# Patient Record
Sex: Female | Born: 2000 | Race: White | Hispanic: No | Marital: Single | Smoking: Never smoker
Health system: Southern US, Community
[De-identification: ages and names within clinical notes are randomized; demographics above are authoritative.]

## PROBLEM LIST (undated history)

## (undated) DIAGNOSIS — B2790 Infectious mononucleosis, unspecified without complication: Secondary | ICD-10-CM

## (undated) DIAGNOSIS — R17 Unspecified jaundice: Secondary | ICD-10-CM

## (undated) DIAGNOSIS — R7989 Other specified abnormal findings of blood chemistry: Secondary | ICD-10-CM

## (undated) HISTORY — DX: Infectious mononucleosis, unspecified without complication: B27.90

## (undated) HISTORY — DX: Unspecified jaundice: R17

## (undated) HISTORY — DX: Other specified abnormal findings of blood chemistry: R79.89

## (undated) HISTORY — PX: NO PAST SURGERIES: SHX2092

---
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
## 2020-05-01 ENCOUNTER — Ambulatory Visit (INDEPENDENT_AMBULATORY_CARE_PROVIDER_SITE_OTHER): Admitting: Nurse Practitioner

## 2020-05-01 ENCOUNTER — Encounter: Payer: Self-pay | Admitting: Nurse Practitioner

## 2020-05-01 ENCOUNTER — Other Ambulatory Visit (INDEPENDENT_AMBULATORY_CARE_PROVIDER_SITE_OTHER)

## 2020-05-01 VITALS — BP 88/50 | HR 100 | Temp 98.9°F | Ht 63.0 in | Wt 123.0 lb

## 2020-05-01 DIAGNOSIS — B179 Acute viral hepatitis, unspecified: Secondary | ICD-10-CM

## 2020-05-01 DIAGNOSIS — B2799 Infectious mononucleosis, unspecified with other complication: Secondary | ICD-10-CM

## 2020-05-01 DIAGNOSIS — B2790 Infectious mononucleosis, unspecified without complication: Secondary | ICD-10-CM

## 2020-05-01 DIAGNOSIS — B199 Unspecified viral hepatitis without hepatic coma: Secondary | ICD-10-CM | POA: Insufficient documentation

## 2020-05-01 DIAGNOSIS — B251 Cytomegaloviral hepatitis: Secondary | ICD-10-CM | POA: Diagnosis not present

## 2020-05-01 DIAGNOSIS — B178 Other specified acute viral hepatitis: Secondary | ICD-10-CM | POA: Insufficient documentation

## 2020-05-01 LAB — HEPATIC FUNCTION PANEL
ALT: 227 U/L — ABNORMAL HIGH (ref 0–35)
AST: 195 U/L — ABNORMAL HIGH (ref 0–37)
Albumin: 3.3 g/dL — ABNORMAL LOW (ref 3.5–5.2)
Alkaline Phosphatase: 727 U/L — ABNORMAL HIGH (ref 39–117)
Bilirubin, Direct: 1.3 mg/dL — ABNORMAL HIGH (ref 0.0–0.3)
Total Bilirubin: 2.7 mg/dL — ABNORMAL HIGH (ref 0.2–1.2)
Total Protein: 6.5 g/dL (ref 6.0–8.3)

## 2020-05-01 LAB — PROTIME-INR
INR: 0.9 ratio (ref 0.8–1.0)
Prothrombin Time: 10.4 s (ref 9.6–13.1)

## 2020-05-01 NOTE — Progress Notes (Signed)
05/01/2020 Elizabeth Suarez [id number] 2001/03/16   CHIEF COMPLAINT:  Acute hepatitis   HISTORY OF PRESENT ILLNESS: Elizabeth Suarez is a 19 year old female with no significant past medical history.  Confirmed COVID-19 infection April 2021 and subsequently received a Moderna vaccination.  She turned 19 on [birth year]2020 and she drank 4 shots of alcohol on her birthday and on 04/11/2020. She had URI symptoms 1 week prior. She continued to feel fatigued, she had difficulty eating and drinking any fluid.  Her urine was dark in color.  She also noted having a fever of 102 F. She went to [facility] on 04/15/2020.  She received IV fluids and she stated Covid and flu testing were negative.  Mono spot testing was positive.  She stated she was prescribed Bactrim 1 tab daily for presumed UTI which she took for 3 days and stopped it.  She continued to have a poor appetite, urine remained dark and her eyes turned yellow with jaundice.  She developed nausea and vomiting.  No upper or lower abdominal pain.  She continued to have mild URI symptoms and she developed a red raised rash to her neck on 11/20.  She presented to [facility] 11/20 for further evaluation.  A Covid test was initially positive then 2 or 3 subsequent Covid test were negative and it was determined that she did not have active Covid infection.  Chest x-ray was negative.  Respiratory viral panel was positive for rhinovirus/enterovirus.  Strep negative. Labs in the ED showed a WBC 20.5. Hemoglobin 13.2.  Total bili 6.3.  Alk phos 929.  AST 479.  ALT 422.  INR 1.06.  BUN 10.  Creatinine 0.8. CT of the abdomen pelvis showed periportal edema, gallbladder wall thickening, small volume ascites and mild splenomegaly. She was transferred to [facility] 04/20/2020.  HIV, hepatitis A and B IgM and hepatitis C antibody levels were negative.  Ceruloplasmin level was normal. CMV and EBV IgM positive.  EBV viral load 14,559. Abdominal MRI with MRCP with  and without contrast 11/23 showed marked gallbladder wall thickening, pancreatic divisum, splenomegaly without evidence of biliary obstruction.  She was evaluated by hepatology and ID who agreed she had acute EBV and CMV infection. She received supportive care and her LFTs stabilized. Alk phos 1133. Total bili 8.0.  AST 396.  ALT 293 at the time of discharge on 04/24/2020.  She followed up with her PCP on 04/28/2020 and labs showed  Alk phos 1002. T. Bili 4.1. AST 215. ALT 200. WBC16.5 Hg 11.7. HCT 34.8. PLT 289. TSH 1.81. She was referred to our office for further hepatology evaluation.   Currently, her appetite has improved.  She is eating 3 healthy meals daily.  Her urine is golden yellow in color.  She is passing normal formed brown bowel movements daily.  No rectal bleeding or black stool.  She continues to have a diffuse red raised rash to her neck, chest, abdomen, back, arms, buttocks and legs.  She continues to have a fever at night with sweats.  Her temperature last night was 101.4.  She took extra strength Tylenol 500 mg 2 tabs on 11/18, 11/19 and 04/20/2019.  She took Ibuprofen 600 mg for fever on 11/30 and on 12/1.  She previously took Advil 200 mg 2 tabs 3 times monthly for headaches or aches and pains.  She denies excessive Tylenol use.  No alcohol since 11/12.  No prior history of alcohol abuse or binge drinking.  No drug use.  No confusion.  No family history of liver disease. Her mother is present.   Abdominal/pelvic CT with contrast 04/19/2020: 1. No evidence of pulmonary embolism. No acute intrathoracic  process.  2. Wedge-shaped soft tissue attenuation in the anterior mediastinum  is most likely reflective of thymic remnant in a patient of this age  given location, configuration, and absence of surrounding fat  stranding or adjacent traumatic features such as sternal fracture.  3. Periportal edema, nonspecific, can be seen with aggressive  hydration and acute intrinsic liver  disease/hepatitis.  4. Circumferential thickening of the gallbladder with mucosal  hyperemia. Nonspecific given the suggestion of possible intrinsic  liver disease though acute cholecystitis is not excluded. No visible  calcified gallstones or biliary ductal dilatation. If there is  clinical concern for acute cholecystitis, consider further  evaluation with right upper quadrant ultrasound.  5. Small volume simple attenuation free fluid seen predominantly  along the inferior margin of the liver, tracking paracolic gutter,  and layering in the deep pelvis, greater than expected for  physiologic free fluid. Possibly reactive or related to acute liver  disease.  6. Mild splenomegaly.  7. Nonspecific prominence of the gonadal veins bilaterally. Could be  seen with pelvic congestion syndrome in the appropriate clinical  setting.  8. Small amount of contrast media in the distal thoracic esophagus,  correlate for features of reflux.   :  Abdominal/MRI with MRCP with and without contrast 04/22/2020: Marked gallbladder wall edema.  Nonspecific, this can be seen in the setting of hypoalbuminemia or underlying acute liver injury. Pancreas divisum.  No evidence of biliary obstruction. Splenomegaly, which may be secondary to patient's mononucleosis 1.3 cm enhancing lesion in the left breast.  Social history: The patient is a Electronics engineer.  Non-smoker.  No drug use.  See alcohol intake in HPI.  Family history: No family history of liver disease.   No Known Allergies    Outpatient Encounter Medications as of 05/01/2020  Medication Sig  . ibuprofen (ADVIL,MOTRIN) 200 MG tablet Take 600 mg by mouth every 6 (six) hours as needed.  Marland Kitchen ibuprofen (ADVIL,MOTRIN) 400 MG tablet Take 1 tablet (400 mg total) by mouth every 6 (six) hours as needed.   No facility-administered encounter medications on file as of 05/01/2020.     REVIEW OF SYSTEMS:  Gen: See HPI.  No weight loss. CV: Denies chest pain,  palpitations or edema. Resp: + Cough is improving. GI: Denies heartburn, dysphagia, stomach or lower abdominal pain. No diarrhea or constipation.  GU : See HPI. MS: Generalized weakness. Derm: Diffuse red raised rash to neck, chest, abdomen, back, extremities and buttocks Psych: Denies depression or anxiety. Heme: Denies bruising, bleeding. Neuro:  Denies headaches, dizziness or paresthesias. Endo:  Denies any problems with DM, thyroid or adrenal function.   PHYSICAL EXAM: BP (!) 88/50 (BP Location: Left Arm, Patient Position: Sitting, Cuff Size: Normal)   Pulse 100   Temp 98.9 F (37.2 C)   Ht _0  (1.6 m) Comment: height measured without shoes  Wt 123 lb (55.8 kg)   LMP 03/31/2020   BMI 21.79 kg/m   General: Well developed 19 year old female in no acute distress. Head: Normocephalic and atraumatic. Eyes: Mild scleral icterus, conjunctive pink. Ears: Normal auditory acuity. Mouth: Dentition intact. No ulcers or lesions.  Neck: Supple, no lymphadenopathy or thyromegaly.  Lungs: Clear bilaterally to auscultation without wheezes, crackles or rhonchi. Heart: Regular rate and rhythm. No murmur, rub or gallop appreciated.  Abdomen: Soft, nontender, non distended.  No masses. No obvious hepatosplenomegaly. Normoactive bowel sounds x 4 quadrants.  Rectal: Deferred. Musculoskeletal: Symmetrical with no gross deformities. Skin: Warm and dry.  Scattered erythematous macular papular rash to neck, back, abdomen, chest, extremities.  No jaundice. Extremities: No edema. Neurological: Alert oriented x 4, no focal deficits.  Psychological:  Alert and cooperative. Normal mood and affect.  ASSESSMENT AND PLAN:  64.  19 year old female with viral hepatitis with acute liver injury/cholestasis. Positive EBV IgM and Viral load + Positive CMV IgM -Repeat hepatic panel, INR.  ANA, SMA, AMA and IgG level. -No plans for liver biopsy at this time -No indication for Prednisone  -May take Tylenol 500  mg 2 tabs p.o. once daily for fever as needed -Supportive care -May require ID consult/follow up -College excuse letter provided for 1 week, most likely will require several weeks off school -Further recommendations to be determined after the above lab results received -Follow up in the office with Dr. Loletha Carrow in 2 weeks  2. Leukocytosis, secondary to # 1    CC:  Susy Frizzle, MD

## 2020-05-01 NOTE — Patient Instructions (Addendum)
Your provider has requested that you go to the basement level for lab work before leaving today. Press "B" on the elevator. The lab is located at the first door on the left as you exit the elevator.  If you are age 21 or older, your body mass index should be between 23-30. Your Body mass index is 21.79 kg/m. If this is out of the aforementioned range listed, please consider follow up with your Primary Care Provider.  If you are age 26 or younger, your body mass index should be between 19-25. Your Body mass index is 21.79 kg/m. If this is out of the aformentioned range listed, please consider follow up with your Primary Care Provider.   DO NOT TAKE PREDNISONE   Please keep follow up with Dr. Myrtie Neither on- 05/12/20 @ 2:00pm   Due to recent changes in healthcare laws, you may see the results of your imaging and laboratory studies on MyChart before your provider has had a chance to review them.  We understand that in some cases there may be results that are confusing or concerning to you. Not all laboratory results come back in the same time frame and the provider may be waiting for multiple results in order to interpret others.  Please give [place] 48 hours in order for your provider to thoroughly review all the results before contacting the office for clarification of your results.   Thank you for choosing me and [facility].  [employer] CRNP

## 2020-05-02 ENCOUNTER — Other Ambulatory Visit: Payer: Self-pay

## 2020-05-02 DIAGNOSIS — B251 Cytomegaloviral hepatitis: Secondary | ICD-10-CM

## 2020-05-02 DIAGNOSIS — B179 Acute viral hepatitis, unspecified: Secondary | ICD-10-CM

## 2020-05-02 DIAGNOSIS — B2790 Infectious mononucleosis, unspecified without complication: Secondary | ICD-10-CM

## 2020-05-05 ENCOUNTER — Other Ambulatory Visit: Payer: Self-pay | Admitting: Nurse Practitioner

## 2020-05-05 MED ORDER — HYDROXYZINE HCL 10 MG PO TABS: 10.0000 mg | ORAL_TABLET | Freq: Three times a day (TID) | ORAL | 0 refills | Status: DC | PRN

## 2020-05-06 ENCOUNTER — Telehealth: Payer: Self-pay | Admitting: Nurse Practitioner

## 2020-05-06 LAB — ANA: Anti Nuclear Antibody (ANA): NEGATIVE

## 2020-05-06 LAB — ANTI-SMOOTH MUSCLE ANTIBODY, IGG: Actin (Smooth Muscle) Antibody (IGG): 47 U — ABNORMAL HIGH (ref <20)

## 2020-05-06 LAB — MITOCHONDRIAL ANTIBODIES: Mitochondrial M2 Ab, IgG: <=20 U

## 2020-05-06 LAB — IGG: IgG (Immunoglobin G), Serum: 1028 mg/dL (ref 600–1640)

## 2020-05-06 NOTE — Telephone Encounter (Signed)
Beth, you previously entered a lab order for a hepatic panel to be done this week on Thursday 12/9 as requested.  Can you please change the lab order to include a hepatic panel and add PT/INR. Patient can go to the lab tomorrow Wed 12/8 or Thurs 12/9.

## 2020-05-06 NOTE — Progress Notes (Signed)
____________________________________________________________  Attending physician addendum:  Thank you for sending this case to me and discussing it the day of clinic visit. I have reviewed the entire note and agree with the plan.  Protracted course of cholestatic hepatitis from acute viral infection.   Had hepatology evaluation during recent hospitalization, and the CT findings of peri-portal and GB edema are secondary to the hepatitis.  Labs day of clinic visit here show slowly improving LFTs, normal INR and negative auto-immune labs.  Rash cause unclear, but perhaps reaction to sulfa antibiotic.  I expect she will continue to improve and will see her in clinic as scheduled next week.  Amada Jupiter, MD  ____________________________________________________________

## 2020-05-07 ENCOUNTER — Other Ambulatory Visit (INDEPENDENT_AMBULATORY_CARE_PROVIDER_SITE_OTHER)

## 2020-05-07 ENCOUNTER — Other Ambulatory Visit: Payer: Self-pay

## 2020-05-07 ENCOUNTER — Telehealth: Payer: Self-pay | Admitting: Gastroenterology

## 2020-05-07 DIAGNOSIS — B179 Acute viral hepatitis, unspecified: Secondary | ICD-10-CM

## 2020-05-07 DIAGNOSIS — B178 Other specified acute viral hepatitis: Secondary | ICD-10-CM | POA: Diagnosis not present

## 2020-05-07 DIAGNOSIS — B2790 Infectious mononucleosis, unspecified without complication: Secondary | ICD-10-CM

## 2020-05-07 DIAGNOSIS — B2799 Infectious mononucleosis, unspecified with other complication: Secondary | ICD-10-CM | POA: Diagnosis not present

## 2020-05-07 DIAGNOSIS — B251 Cytomegaloviral hepatitis: Secondary | ICD-10-CM

## 2020-05-07 LAB — PROTIME-INR
INR: 1 ratio (ref 0.8–1.0)
Prothrombin Time: 11.7 s (ref 9.6–13.1)

## 2020-05-07 LAB — HEPATIC FUNCTION PANEL
ALT: 380 U/L — ABNORMAL HIGH (ref 0–35)
AST: 244 U/L — ABNORMAL HIGH (ref 0–37)
Albumin: 3.9 g/dL (ref 3.5–5.2)
Alkaline Phosphatase: 543 U/L — ABNORMAL HIGH (ref 39–117)
Bilirubin, Direct: 1 mg/dL — ABNORMAL HIGH (ref 0.0–0.3)
Total Bilirubin: 2.1 mg/dL — ABNORMAL HIGH (ref 0.2–1.2)
Total Protein: 7 g/dL (ref 6.0–8.3)

## 2020-05-07 NOTE — Telephone Encounter (Signed)
Repeat labs ordered STAT for 05/12/20. CBC CMP and PT/INR. Patient is to come in the morning. Her appointment with Dr Myrtie Neither is scheduled for 2:00 pm.

## 2020-05-07 NOTE — Telephone Encounter (Signed)
Yes, no problem. Lab added.

## 2020-05-07 NOTE — Telephone Encounter (Signed)
Spoke with Ms Elizabeth Suarez. She is inquiring about the increase in some of the liver function test numbers. Wants to know if this was expected since patient started the prednisone. Also will need a note for the college stating a specific date she will be out even if that has to be changed and extended as the end date draws near. This will help them to assist her with her out of class studies. She can access the new note through My Chart and forward to the school. Thanks

## 2020-05-08 ENCOUNTER — Encounter: Payer: Self-pay | Admitting: Nurse Practitioner

## 2020-05-08 NOTE — Telephone Encounter (Signed)
Elizabeth Suarez, I entered a very brief letter excusing her from class until 12/15. Further recommendations will be determined after her repeat labs and after Dr. Myrtie Neither sees her. I called the patient and discussed her mild increase in her AST and ALT levels are due to the liver inflammation from her viral infection and is not worrisome at this time. Her INR is normal and T. Bili is less which is reassuring.  We will see how her labs look on Monday and Dr. Myrtie Neither will review those results and determine what further evaluation is required at that point.  Thank you

## 2020-05-12 ENCOUNTER — Ambulatory Visit (INDEPENDENT_AMBULATORY_CARE_PROVIDER_SITE_OTHER): Admitting: Gastroenterology

## 2020-05-12 ENCOUNTER — Encounter: Payer: Self-pay | Admitting: Gastroenterology

## 2020-05-12 ENCOUNTER — Other Ambulatory Visit (INDEPENDENT_AMBULATORY_CARE_PROVIDER_SITE_OTHER)

## 2020-05-12 VITALS — BP 102/64 | HR 64 | Ht 63.0 in | Wt 116.0 lb

## 2020-05-12 DIAGNOSIS — B178 Other specified acute viral hepatitis: Secondary | ICD-10-CM | POA: Diagnosis not present

## 2020-05-12 DIAGNOSIS — B2799 Infectious mononucleosis, unspecified with other complication: Secondary | ICD-10-CM | POA: Diagnosis not present

## 2020-05-12 DIAGNOSIS — R7989 Other specified abnormal findings of blood chemistry: Secondary | ICD-10-CM

## 2020-05-12 DIAGNOSIS — R945 Abnormal results of liver function studies: Secondary | ICD-10-CM

## 2020-05-12 LAB — CBC WITH DIFFERENTIAL/PLATELET
Basophils Absolute: 0.1 10*3/uL (ref 0.0–0.1)
Basophils Relative: 0.7 % (ref 0.0–3.0)
Eosinophils Absolute: 0 10*3/uL (ref 0.0–0.7)
Eosinophils Relative: 0.3 % (ref 0.0–5.0)
HCT: 40 % (ref 36.0–46.0)
Hemoglobin: 13.3 g/dL (ref 12.0–15.0)
Lymphocytes Relative: 59.4 % — ABNORMAL HIGH (ref 12.0–46.0)
Lymphs Abs: 5.4 10*3/uL — ABNORMAL HIGH (ref 0.7–4.0)
MCHC: 33.3 g/dL (ref 30.0–36.0)
MCV: 87.3 fl (ref 78.0–100.0)
Monocytes Absolute: 1.1 10*3/uL — ABNORMAL HIGH (ref 0.1–1.0)
Monocytes Relative: 12.2 % — ABNORMAL HIGH (ref 3.0–12.0)
Neutro Abs: 2.5 10*3/uL (ref 1.4–7.7)
Neutrophils Relative %: 27.4 % — ABNORMAL LOW (ref 43.0–77.0)
Platelets: 496 10*3/uL — ABNORMAL HIGH (ref 150.0–400.0)
RBC: 4.59 Mil/uL (ref 3.87–5.11)
RDW: 17.3 % — ABNORMAL HIGH (ref 11.5–15.5)
WBC: 9 10*3/uL (ref 4.0–10.5)

## 2020-05-12 LAB — COMPREHENSIVE METABOLIC PANEL
ALT: 141 U/L — ABNORMAL HIGH (ref 0–35)
AST: 42 U/L — ABNORMAL HIGH (ref 0–37)
Albumin: 4.5 g/dL (ref 3.5–5.2)
Alkaline Phosphatase: 319 U/L — ABNORMAL HIGH (ref 39–117)
BUN: 14 mg/dL (ref 6–23)
CO2: 29 mEq/L (ref 19–32)
Calcium: 10.1 mg/dL (ref 8.4–10.5)
Chloride: 101 mEq/L (ref 96–112)
Creatinine, Ser: 0.58 mg/dL (ref 0.40–1.20)
GFR: 129.66 mL/min (ref >60.00)
Glucose, Bld: 96 mg/dL (ref 70–99)
Potassium: 4.4 mEq/L (ref 3.5–5.1)
Sodium: 140 mEq/L (ref 135–145)
Total Bilirubin: 1.7 mg/dL — ABNORMAL HIGH (ref 0.2–1.2)
Total Protein: 7.9 g/dL (ref 6.0–8.3)

## 2020-05-12 LAB — PROTIME-INR
INR: 1 ratio (ref 0.8–1.0)
Prothrombin Time: 11.1 s (ref 9.6–13.1)

## 2020-05-12 NOTE — Progress Notes (Addendum)
[facility] Progress Note  Chief Complaint: Acute mononucleosis hepatitis with jaundice  Subjective  History:  Elizabeth Suarez is here with her mother today.  I have had multiple conversations and chart/lab review with our nurse practitioner who saw Elizabeth Suarez recently.  I have been periodically reviewing lab results and helping coordinate the plan and follow-up.  Elizabeth Suarez has slowly improved over the last couple of weeks.  She does not have abdominal pain nausea or vomiting.  Her appetite and energy level are returning. She took f several days of prednisone that have been prescribed by primary care, and the diffuse trunk and extremity rash completely resolved.  ROS: Cardiovascular:  no chest pain Respiratory: no dyspnea Generalized fatigue The patient's Past Medical, Family and Social History were reviewed and are on file in the EMR.  Objective:  Med list reviewed  Current Outpatient Medications:  .  hydrOXYzine (ATARAX/VISTARIL) 10 MG tablet, Take 1 tablet (10 mg total) by mouth 3 (three) times daily as needed for itching., Disp: 21 tablet, Rfl: 0   Vital signs in last 24 hrs: Vitals:   05/12/20 1408  BP: 102/64  Pulse: 64    Physical Exam   HEENT: sclera faintly icteric, oral mucosa moist without lesions  Neck: supple, no thyromegaly, JVD or lymphadenopathy  Cardiac: RRR without murmurs, S1S2 heard, no peripheral edema  Pulm: clear to auscultation bilaterally, normal RR and effort noted  Abdomen: soft, no tenderness, with active bowel sounds. No palpable hepatomegaly or splenomegaly  Skin; warm and dry, no jaundice or rash (reports it has been present over the trunk and both arms and legs.  Her mother showed me a photograph, same I had seen from our NP last week.  Labs:  CMP Latest Ref Rng & Units 05/12/2020 05/07/2020 05/01/2020  Glucose 70 - 99 mg/dL 96 - -  BUN 6 - 23 mg/dL 14 - -  Creatinine 2.83 - 1.20 mg/dL 6.62 - -  Sodium 947 - 145 mEq/L 140 - -   Potassium 3.5 - 5.1 mEq/L 4.4 - -  Chloride 96 - 112 mEq/L 101 - -  CO2 19 - 32 mEq/L 29 - -  Calcium 8.4 - 10.5 mg/dL 65.4 - -  Total Protein 6.0 - 8.3 g/dL 7.9 7.0 6.5  Total Bilirubin 0.2 - 1.2 mg/dL 6.5(K) 2.1(H) 2.7(H)  Alkaline Phos 39 - 117 U/L 319(H) 543(H) 727(H)  AST 0 - 37 U/L 42(H) 244(H) 195(H)  ALT 0 - 35 U/L 141(H) 380(H) 227(H)   Elevated ASMA Ab at 47 (recent normal ANA and IgG)  Additional records related to her recent hospitalizations were also reviewed.  CBC Latest Ref Rng & Units 05/12/2020  WBC 4.0 - 10.5 K/uL 9.0  Hemoglobin 12.0 - 15.0 g/dL 35.4  Hematocrit 65.6 - 46.0 % 40.0  Platelets 150.0 - 400.0 K/uL 496.0(H)     Lab Results  Component Value Date   INR 1.0 05/12/2020   INR 1.0 05/07/2020   INR 0.9 05/01/2020    ___________________________________________ Radiologic studies:   ____________________________________________ Other:   _____________________________________________ Assessment & Plan  Assessment: Encounter Diagnoses  Name Primary?  . Infectious mononucleosis hepatitis Yes  . LFTs abnormal    Elizabeth Suarez has severe but resolving cholestatic hepatitis from acute mononucleosis infection.  She was extensively worked up at [facility] and reportedly seen by hepatology there. I believe her rash was a reaction to Bactrim, and I recommended that she list that as an allergy and avoid sulfa-containing medicines in the future.  The elevated smooth muscle antibody is of unclear significance, but I think autoimmune hepatitis is a less likely possibility here.  Her LFTs were already improving, albeit slowly, before the prednisone was started.  Elevated ASMA can be seen in other types of hepatitis besides AIH (alcohol, infectious, drug-induced).  I will certainly keep it in mind in case her LFTs do not normalize as expected, and certainly if they start to rise again.  In that case, liver biopsy may be warranted.  I mentioned that briefly with them, but I  expect her condition will continue to improve   Plan: Hepatic function panel in a week  She will be off college for holiday break until mid January, and from my perspective has no specific restrictions related to this condition as it stands now.   30 minutes were spent on this encounter (including extensive chart review, history/exam, counseling/coordination of care, and documentation)  Charlie Pitter III

## 2020-05-12 NOTE — Patient Instructions (Signed)
If you are age 21 or older, your body mass index should be between 23-30. Your Body mass index is 20.55 kg/m. If this is out of the aforementioned range listed, please consider follow up with your Primary Care Provider.  If you are age 6 or younger, your body mass index should be between 19-25. Your Body mass index is 20.55 kg/m. If this is out of the aformentioned range listed, please consider follow up with your Primary Care Provider.   Please repeat labs in 1 week!  05-19-2020  It was a pleasure to see you today!  Dr. Myrtie Neither

## 2020-05-13 ENCOUNTER — Other Ambulatory Visit: Payer: Self-pay

## 2020-05-13 DIAGNOSIS — B2799 Infectious mononucleosis, unspecified with other complication: Secondary | ICD-10-CM

## 2020-05-13 DIAGNOSIS — R7989 Other specified abnormal findings of blood chemistry: Secondary | ICD-10-CM

## 2020-05-20 ENCOUNTER — Other Ambulatory Visit (INDEPENDENT_AMBULATORY_CARE_PROVIDER_SITE_OTHER)

## 2020-05-20 DIAGNOSIS — R7989 Other specified abnormal findings of blood chemistry: Secondary | ICD-10-CM

## 2020-05-20 DIAGNOSIS — R945 Abnormal results of liver function studies: Secondary | ICD-10-CM | POA: Diagnosis not present

## 2020-05-20 DIAGNOSIS — B178 Other specified acute viral hepatitis: Secondary | ICD-10-CM | POA: Diagnosis not present

## 2020-05-20 DIAGNOSIS — B2799 Infectious mononucleosis, unspecified with other complication: Secondary | ICD-10-CM

## 2020-05-20 LAB — HEPATIC FUNCTION PANEL
ALT: 233 U/L — ABNORMAL HIGH (ref 0–35)
AST: 128 U/L — ABNORMAL HIGH (ref 0–37)
Albumin: 4.3 g/dL (ref 3.5–5.2)
Alkaline Phosphatase: 162 U/L — ABNORMAL HIGH (ref 39–117)
Bilirubin, Direct: 0.5 mg/dL — ABNORMAL HIGH (ref 0.0–0.3)
Total Bilirubin: 1.2 mg/dL (ref 0.2–1.2)
Total Protein: 7.1 g/dL (ref 6.0–8.3)

## 2020-05-21 ENCOUNTER — Telehealth: Payer: Self-pay | Admitting: Gastroenterology

## 2020-05-21 NOTE — Telephone Encounter (Signed)
Spoke with patient's mother, she has several questions regarding the reasoning behind liver biopsy - discussed the increase in some of the labs, mother states that patient has been off of Prednisone for about a week for a rash. She states that some of her levels were fairly low the last time, advised that this could have been from the prednisone as it reduces inflammation and now that she is no longer taking it the numbers have increased again. Mother stated that there are risks associated with having the biopsy and wanting to know if the labs could be watched longer before proceeding with the biopsy. She states that the patient does not really understand what is going on. Will you call and discuss with the mother? Her number is [phone]. Thank you.

## 2020-05-21 NOTE — Telephone Encounter (Signed)
Inbound call from patient's mother.  States Dr. Myrtie Neither spoke with patient today in regards to her lab results and about having a liver biopsy.  Mother has additional questions and requests a call back please.

## 2020-05-22 ENCOUNTER — Other Ambulatory Visit: Payer: Self-pay

## 2020-05-22 ENCOUNTER — Telehealth: Payer: Self-pay | Admitting: Family Medicine

## 2020-05-22 DIAGNOSIS — R7989 Other specified abnormal findings of blood chemistry: Secondary | ICD-10-CM

## 2020-05-22 NOTE — Telephone Encounter (Signed)
Patients mother Lillie Columbia called in requesting an appointment. It looks as if patient hasn't been seen since 2015. I explained the 3 year policy that patient would now be considered a new patient. Mother explained that patient hasn't been sick until now and they didn't know the 3 year policy.   She really would like you to consider taking patient back since she was sick over the Thanksgiving holiday and now they are recommending a liver biopsy and she trust your opinion and you see all of the family.  CB# [phone]

## 2020-05-22 NOTE — Telephone Encounter (Signed)
Order in epic. 

## 2020-05-22 NOTE — Telephone Encounter (Signed)
I will be glad to see her.

## 2020-05-22 NOTE — Telephone Encounter (Signed)
I spoke with Elizabeth Suarez's mother Elizabeth Suarez regarding the elevated LFTs and my recommendation for liver biopsy.  She had questions and concerns, all of which were addressed.  She tells me that she and [name] discussed it last evening and had decided to hold off on the liver biopsy and get repeat labs soon.  Please put in orders for a hepatic function panel in the morning of December 29th.  I let Elizabeth Suarez know that I would be out of town all next week and following up on this when I return on January 3.  However, please check the lab results later in the day on the 29th or the following day and, if rising, please ask the DOD to review it to ensure nothing requiring urgent attention prior to the office being closed for new year holiday.  - HD

## 2020-05-26 NOTE — Telephone Encounter (Signed)
Patient is scheduled for 05/27/2020 at 12:00.

## 2020-05-27 ENCOUNTER — Other Ambulatory Visit: Payer: Self-pay

## 2020-05-27 ENCOUNTER — Ambulatory Visit (INDEPENDENT_AMBULATORY_CARE_PROVIDER_SITE_OTHER): Admitting: Family Medicine

## 2020-05-27 VITALS — BP 108/60 | HR 81 | Temp 97.4°F | Ht 63.0 in | Wt 120.0 lb

## 2020-05-27 DIAGNOSIS — K759 Inflammatory liver disease, unspecified: Secondary | ICD-10-CM | POA: Diagnosis not present

## 2020-05-27 NOTE — Progress Notes (Signed)
Subjective:    Patient ID: Elizabeth Suarez, female    DOB: 2000/08/21, 19 y.o.   MRN: [id number]  HPI  Patient is a very pleasant 19 year old Caucasian female here today to reestablish care.  She has had a very complicated recent history.  In November, she was celebrating her 19st birthday.  She became extremely ill shortly thereafter.  Her liver function tests were found to be extremely high.  [facility].  She tested positive for mono.  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  However the symptoms continue to worsen.  She was started on Bactrim for a "UTI.  She then developed diffuse itching all over the body in early December along with elevated liver function test and jaundice.  Ultimately she developed a diffuse purpuric rash on all of her extremities.  She was told that she had a Stevens-Johnson reaction to Bactrim.  Bactrim was stopped immediately and she was started on prednisone.  Around the same time that she started prednisone, she had a lab test that showed a positive anti-smooth muscle antibody test.  While on the prednisone, her liver function test started to trend down dramatically.  She then stop the prednisone after the rash subsided.  Repeat liver function test last week have shown a slight elevation.  Therefore her gastroenterologist recently recommended a liver biopsy to distinguish between post viral hepatitis secondary to EBV versus autoimmune hepatitis partially treated with prednisone.  Family is here for a second opinion. Past Medical History:  Diagnosis Date  . Elevated LFTs   . Jaundice   . Mononucleosis    Past Surgical History:  Procedure Laterality Date  . NO PAST SURGERIES     No current outpatient medications on file prior to visit.   No current facility-administered medications on file prior to visit.   Allergies  Allergen Reactions  . Bactrim [Sulfamethoxazole-Trimethoprim] Rash  . Sulfa Antibiotics Rash   Social  History   Socioeconomic History  . Marital status: Single    Spouse name: Not on file  . Number of children: 0  . Years of education: Not on file  . Highest education level: Not on file  Occupational History  . Occupation: Consulting civil engineerstudent  Tobacco Use  . Smoking status: Never Smoker  . Smokeless tobacco: Never Used  Vaping Use  . Vaping Use: Never used  Substance and Sexual Activity  . Alcohol use: Yes    Comment: 4 shots on the weekends  . Drug use: No  . Sexual activity: Not on file  Other Topics Concern  . Not on file  Social History Narrative  . Not on file   Social Determinants of Health   Financial Resource Strain: Not on file  Food Insecurity: Not on file  Transportation Needs: Not on file  Physical Activity: Not on file  Stress: Not on file  Social Connections: Not on file  Intimate Partner Violence: Not on file   Appointment on 05/20/2020  Component Date Value Ref Range Status  . Total Bilirubin 05/20/2020 1.2  0.2 - 1.2 mg/dL Final  . Bilirubin, Direct 05/20/2020 0.5* 0.0 - 0.3 mg/dL Final  . Alkaline Phosphatase 05/20/2020 162* 39 - 117 U/L Final  . AST 05/20/2020 128* 0 - 37 U/L Final  . ALT 05/20/2020 233* 0 - 35 U/L Final  . Total Protein 05/20/2020 7.1  6.0 - 8.3 g/dL Final  . Albumin 40/98/119112/21/2021 4.3  3.5 - 5.2 g/dL Final  Appointment on  05/12/2020  Component Date Value Ref Range Status  . Sodium 05/12/2020 140  135 - 145 mEq/L Final  . Potassium 05/12/2020 4.4  3.5 - 5.1 mEq/L Final  . Chloride 05/12/2020 101  96 - 112 mEq/L Final  . CO2 05/12/2020 29  19 - 32 mEq/L Final  . Glucose, Bld 05/12/2020 96  70 - 99 mg/dL Final  . BUN 85/27/7824 14  6 - 23 mg/dL Final  . Creatinine, Ser 05/12/2020 0.58  0.40 - 1.20 mg/dL Final  . Total Bilirubin 05/12/2020 1.7* 0.2 - 1.2 mg/dL Final  . Alkaline Phosphatase 05/12/2020 319* 39 - 117 U/L Final  . AST 05/12/2020 42* 0 - 37 U/L Final  . ALT 05/12/2020 141* 0 - 35 U/L Final  . Total Protein 05/12/2020 7.9  6.0 -  8.3 g/dL Final  . Albumin 23/53/6144 4.5  3.5 - 5.2 g/dL Final  . GFR 31/54/0086 129.66  >60.00 mL/min Final   Calculated using the CKD-EPI Creatinine Equation (2021)  . Calcium 05/12/2020 10.1  8.4 - 10.5 mg/dL Final  . WBC 76/19/5093 9.0  4.0 - 10.5 K/uL Final  . RBC 05/12/2020 4.59  3.87 - 5.11 Mil/uL Final  . Hemoglobin 05/12/2020 13.3  12.0 - 15.0 g/dL Final  . HCT 26/71/2458 40.0  36.0 - 46.0 % Final  . MCV 05/12/2020 87.3  78.0 - 100.0 fl Final  . MCHC 05/12/2020 33.3  30.0 - 36.0 g/dL Final  . RDW 09/98/3382 17.3* 11.5 - 15.5 % Final  . Platelets 05/12/2020 496.0* 150.0 - 400.0 K/uL Final  . Neutrophils Relative % 05/12/2020 27.4* 43.0 - 77.0 % Final  . Lymphocytes Relative 05/12/2020 59.4 Repeated and verified X2.* 12.0 - 46.0 % Final  . Monocytes Relative 05/12/2020 12.2* 3.0 - 12.0 % Final  . Eosinophils Relative 05/12/2020 0.3  0.0 - 5.0 % Final  . Basophils Relative 05/12/2020 0.7  0.0 - 3.0 % Final  . Neutro Abs 05/12/2020 2.5  1.4 - 7.7 K/uL Final  . Lymphs Abs 05/12/2020 5.4* 0.7 - 4.0 K/uL Final  . Monocytes Absolute 05/12/2020 1.1* 0.1 - 1.0 K/uL Final  . Eosinophils Absolute 05/12/2020 0.0  0.0 - 0.7 K/uL Final  . Basophils Absolute 05/12/2020 0.1  0.0 - 0.1 K/uL Final  . INR 05/12/2020 1.0  0.8 - 1.0 ratio Final  . Prothrombin Time 05/12/2020 11.1  9.6 - 13.1 sec Final  Appointment on 05/07/2020  Component Date Value Ref Range Status  . INR 05/07/2020 1.0  0.8 - 1.0 ratio Final  . Prothrombin Time 05/07/2020 11.7  9.6 - 13.1 sec Final  . Total Bilirubin 05/07/2020 2.1* 0.2 - 1.2 mg/dL Final  . Bilirubin, Direct 05/07/2020 1.0* 0.0 - 0.3 mg/dL Final  . Alkaline Phosphatase 05/07/2020 543* 39 - 117 U/L Final  . AST 05/07/2020 244* 0 - 37 U/L Final  . ALT 05/07/2020 380* 0 - 35 U/L Final  . Total Protein 05/07/2020 7.0  6.0 - 8.3 g/dL Final  . Albumin 50/53/9767 3.9  3.5 - 5.2 g/dL Final  Appointment on 34/19/3790  Component Date Value Ref Range Status  . Actin  (Smooth Muscle) Antibody (IG* 05/01/2020 47* <20 U Final   Comment: . Reference Range:    <20 U: Negative >or=20 U: Positive . Marland Kitchen Antibodies recognizing actin are the main component of smooth muscle antibodies associated with auto- immune liver disease. Actin antibodies are found in approximately 75% of patients with autoimmune hepatitis (AIH) type 1, approximately 65% of patients with autoimmune  cholangitis, approximately 30% of patients with primary biliary cirrhosis and approximately 2% of healthy controls. High values are closely correlated with AIH type 1. .   . Anti Nuclear Antibody (ANA) 05/01/2020 NEGATIVE  NEGATIVE Final   Comment: ANA IFA is a first line screen for detecting the presence of up to approximately 150 autoantibodies in various autoimmune diseases. A negative ANA IFA result suggests an ANA-associated autoimmune disease is not present at this time, but is not definitive. If there is high clinical suspicion for Sjogren's syndrome, testing for anti-SS-A/Ro antibody should be considered. Anti-Jo-1 antibody should be considered for clinically suspected inflammatory myopathies. . AC-0: Negative . International Consensus on ANA Patterns (SeverTies.uy) . For additional information, please refer to http://education.[facility].com/faq/FAQ177 (This link is being provided for informational/ educational purposes only.) .   Marland Kitchen Mitochondrial M2 Ab, IgG 05/01/2020 < OR = 20.0  U Final   Comment:                 Reference Range                 Negative:  < or = 20.0                 Equivocal: 20.1 - 24.9                 Positive:  > or = 25.0   . IgG (Immunoglobin G), Serum 05/01/2020 1,028  600 - 1,640 mg/dL Final  . INR 58/01/9832 0.9  0.8 - 1.0 ratio Final  . Prothrombin Time 05/01/2020 10.4  9.6 - 13.1 sec Final  . Total Bilirubin 05/01/2020 2.7* 0.2 - 1.2 mg/dL Final  . Bilirubin, Direct 05/01/2020 1.3* 0.0 - 0.3 mg/dL Final   . Alkaline Phosphatase 05/01/2020 727* 39 - 117 U/L Final  . AST 05/01/2020 195* 0 - 37 U/L Final  . ALT 05/01/2020 227* 0 - 35 U/L Final  . Total Protein 05/01/2020 6.5  6.0 - 8.3 g/dL Final  . Albumin 82/50/5397 3.3* 3.5 - 5.2 g/dL Final     Review of Systems  All other systems reviewed and are negative.      Objective:   Physical Exam Vitals reviewed.  Constitutional:      General: She is not in acute distress.    Appearance: Normal appearance. She is normal weight.  Cardiovascular:     Rate and Rhythm: Normal rate and regular rhythm.     Heart sounds: Normal heart sounds.  Pulmonary:     Effort: Pulmonary effort is normal. No respiratory distress.     Breath sounds: Normal breath sounds. No wheezing, rhonchi or rales.  Abdominal:     Palpations: Abdomen is soft.     Tenderness: There is no abdominal tenderness. There is no guarding.  Musculoskeletal:     Right lower leg: No edema.     Left lower leg: No edema.  Skin:    Coloration: Skin is not jaundiced.     Findings: No rash.  Neurological:     General: No focal deficit present.     Mental Status: She is alert and oriented to person, place, and time. Mental status is at baseline.           Assessment & Plan:  Hepatitis - Plan: CBC with Differential/Platelet, COMPLETE METABOLIC PANEL WITH GFR, High sensitivity CRP, Anti-Smooth Muscle Antibody, IGG  Spent the majority of our visit today in discussion and obtaining history.  Situation sounds like a decision between post viral hepatitis  secondary to mono versus autoimmune hepatitis.  Gastroenterology has recommended a liver biopsy.  I tried to explain to the family as best I could how this would help differentiate between the 2.  I believe that this would help put theirmind at ease or at least determine a treatment path before patient has to return to school.  My concern is that if she has autoimmune hepatitis and goes back to college without a definitive diagnosis,  she may become ill while at school and have to then come home and miss classes.  However if we scheduled the liver biopsy now, we could either confirm or rule out autoimmune hepatitis thereby instituting treatment if necessary so that her situation would not deteriorate in a few weeks.   My concern is that her liver function tests trended down while she was on prednisone and then increased when repeated on December 21 off prednisone.  She has been off the prednisone now for an additional week.  If her liver function test continue to rise and/or her CRP is elevated, I would recommend scheduling the biopsy as soon as possible as this would favor an autoimmune process.  However if her liver function test continue to trend down even though she is off the prednisone and her CRP is low, this would suggest more likely post viral hepatitis.  Family is comfortable with this plan

## 2020-05-28 NOTE — Telephone Encounter (Signed)
Spoke with patient's mother to remind her that patient is due for labs this morning, mother states that patient was seen by Dr. Tanya Nones yesterday who ordered some lab work, LFTs are decreasing. Advised that I will make you aware of the results and await further recommendations. Mother is aware that Dr. Myrtie Neither is out of the office until next week. Mother verbalized understanding of all information and had no concerns at the end of the call.

## 2020-06-01 LAB — CBC WITH DIFFERENTIAL/PLATELET
Absolute Monocytes: 626 cells/uL (ref 200–950)
Basophils Absolute: 7 cells/uL (ref 0–200)
Basophils Relative: 0.1 %
Eosinophils Absolute: 88 cells/uL (ref 15–500)
Eosinophils Relative: 1.3 %
HCT: 37.1 % (ref 35.0–45.0)
Hemoglobin: 12.4 g/dL (ref 11.7–15.5)
Lymphs Abs: 4311 cells/uL — ABNORMAL HIGH (ref 850–3900)
MCH: 29.8 pg (ref 27.0–33.0)
MCHC: 33.4 g/dL (ref 32.0–36.0)
MCV: 89.2 fL (ref 80.0–100.0)
MPV: 9.6 fL (ref 7.5–12.5)
Monocytes Relative: 9.2 %
Neutro Abs: 1768 cells/uL (ref 1500–7800)
Neutrophils Relative %: 26 %
Platelets: 233 10*3/uL (ref 140–400)
RBC: 4.16 10*6/uL (ref 3.80–5.10)
RDW: 15.5 % — ABNORMAL HIGH (ref 11.0–15.0)
Total Lymphocyte: 63.4 %
WBC: 6.8 10*3/uL (ref 3.8–10.8)

## 2020-06-01 LAB — COMPLETE METABOLIC PANEL WITH GFR
AG Ratio: 1.9 (calc) (ref 1.0–2.5)
ALT: 220 U/L — ABNORMAL HIGH (ref 6–29)
AST: 113 U/L — ABNORMAL HIGH (ref 10–30)
Albumin: 4.5 g/dL (ref 3.6–5.1)
Alkaline phosphatase (APISO): 148 U/L — ABNORMAL HIGH (ref 31–125)
BUN: 16 mg/dL (ref 7–25)
CO2: 27 mmol/L (ref 20–32)
Calcium: 9.8 mg/dL (ref 8.6–10.2)
Chloride: 103 mmol/L (ref 98–110)
Creat: 0.62 mg/dL (ref 0.50–1.10)
GFR, Est African American: 149 mL/min/{1.73_m2} (ref >=60)
GFR, Est Non African American: 129 mL/min/{1.73_m2} (ref >=60)
Globulin: 2.4 g/dL (calc) (ref 1.9–3.7)
Glucose, Bld: 80 mg/dL (ref 65–99)
Potassium: 4.4 mmol/L (ref 3.5–5.3)
Sodium: 138 mmol/L (ref 135–146)
Total Bilirubin: 0.9 mg/dL (ref 0.2–1.2)
Total Protein: 6.9 g/dL (ref 6.1–8.1)

## 2020-06-01 LAB — HIGH SENSITIVITY CRP: hs-CRP: 0.5 mg/L

## 2020-06-01 LAB — ANTI-SMOOTH MUSCLE ANTIBODY, IGG: Actin (Smooth Muscle) Antibody (IGG): 22 U — ABNORMAL HIGH (ref <20)

## 2020-06-01 NOTE — Telephone Encounter (Signed)
They are only slightly lower and I am still concerned about the possibility of autoimmune hepatitis. Please arrange a hepatic function panel for this week because I still feel a liver biopsy is likely to be necessary.  - HD

## 2020-06-02 ENCOUNTER — Other Ambulatory Visit: Payer: Self-pay

## 2020-06-02 NOTE — Telephone Encounter (Signed)
Thank you for the note.  Yes I see that the smooth muscle antibody has come down a lot, and I agree that is reassuring. However, we need to watch the liver labs closely.  I would still like her to have a hepatic function panel toward the end of this week.  - HD

## 2020-06-02 NOTE — Telephone Encounter (Signed)
Spoke with patient's mother, advised that you would like for the patient to come back in for repeat labs this week. Mother states that the anti-smooth muscle antibody has resulted today and she would like for you to review that as well if you have not already done so. She states that those numbers have decreased as well and things seem to be moving in the right direction. Please advise, thank you.

## 2020-06-02 NOTE — Telephone Encounter (Signed)
Spoke with patient's mother in regards to Dr. Myrtie Neither recommendations. She is aware that patient will need to come in later this week for repeat labs. Advised that no appointment is necessary and they can stop by at their convenience between 7:30 AM and 5 PM. Mother verbalized understanding of this information and had no other concerns at the end of the call.

## 2020-06-05 ENCOUNTER — Other Ambulatory Visit (INDEPENDENT_AMBULATORY_CARE_PROVIDER_SITE_OTHER)

## 2020-06-05 DIAGNOSIS — R945 Abnormal results of liver function studies: Secondary | ICD-10-CM | POA: Diagnosis not present

## 2020-06-05 DIAGNOSIS — R7989 Other specified abnormal findings of blood chemistry: Secondary | ICD-10-CM

## 2020-06-05 LAB — HEPATIC FUNCTION PANEL
ALT: 289 U/L — ABNORMAL HIGH (ref 0–35)
AST: 130 U/L — ABNORMAL HIGH (ref 0–37)
Albumin: 4.7 g/dL (ref 3.5–5.2)
Alkaline Phosphatase: 126 U/L — ABNORMAL HIGH (ref 39–117)
Bilirubin, Direct: 0.3 mg/dL (ref 0.0–0.3)
Total Bilirubin: 0.8 mg/dL (ref 0.2–1.2)
Total Protein: 7.3 g/dL (ref 6.0–8.3)

## 2020-06-09 ENCOUNTER — Telehealth: Payer: Self-pay | Admitting: Gastroenterology

## 2020-06-09 DIAGNOSIS — R7989 Other specified abnormal findings of blood chemistry: Secondary | ICD-10-CM

## 2020-06-09 DIAGNOSIS — R945 Abnormal results of liver function studies: Secondary | ICD-10-CM

## 2020-06-09 NOTE — Telephone Encounter (Signed)
Noted. Lab order and reminder in epic.

## 2020-06-09 NOTE — Telephone Encounter (Signed)
[place],  I spoke with Elizabeth Suarez and her mother on a conference call today regarding last weeks liver labs. While alkaline phosphatase and bilirubin are near normal/normal respecitvely, the AST and ALT continue to rise.  This still concerns me if or the possibility of autoimmune hepatitis, and I again recommended a liver biopsy.  They would prefer observation and periodic check of liver labs.  They had seen primary care on December 28 and had been considering getting repeat labs in a month.  Her mother asked about some kind of natural therapy, particularly milk thistle.  I previously recommended against that, and I did so again today.  I also offered consultation with hepatology, either at [facility] (where she was evaluated as inpatient) or with the [facility] hepatology clinic in this area.  They did not want to do that at this point.  Given all that, I recommended repeat LFTs about 2 weeks out from the most recent ones.  [name] is at school in [place], [place] and will drive back to this area for the labs.  She was unable to do it around the 20th because she has class and work, so she will come home on Monday, 06/23/2020 for hepatic function panel.  Please order the labs.  - HD  ______________________________________________ Dr. Tanya Nones,  Lorain Childes on your primary care patient.

## 2020-06-20 ENCOUNTER — Telehealth: Payer: Self-pay

## 2020-06-20 NOTE — Telephone Encounter (Signed)
Patient has been notified via My Chart.

## 2020-06-20 NOTE — Telephone Encounter (Signed)
-----   Message from Missy Sabins, RN sent at 06/09/2020  2:17 PM EST ----- Regarding: Labs Repeat hepatic function panel on 06/23/2020. Order in epic.

## 2020-06-23 ENCOUNTER — Other Ambulatory Visit

## 2020-06-23 DIAGNOSIS — R7989 Other specified abnormal findings of blood chemistry: Secondary | ICD-10-CM

## 2020-06-23 DIAGNOSIS — R945 Abnormal results of liver function studies: Secondary | ICD-10-CM

## 2020-06-23 LAB — HEPATIC FUNCTION PANEL
ALT: 35 U/L (ref 0–35)
AST: 24 U/L (ref 0–37)
Albumin: 4.8 g/dL (ref 3.5–5.2)
Alkaline Phosphatase: 70 U/L (ref 39–117)
Bilirubin, Direct: 0.2 mg/dL (ref 0.0–0.3)
Total Bilirubin: 0.4 mg/dL (ref 0.2–1.2)
Total Protein: 7.3 g/dL (ref 6.0–8.3)

## 2020-06-23 NOTE — Telephone Encounter (Signed)
Called patient vm is full, unable to leave a message at this time.   Called patient's mom as well, left detailed message remind patient's mother that she is due for repeat labs today, advised of lab hours and location, advised that no appt is necessary and to give [place] a call if she has any questions.

## 2020-06-26 ENCOUNTER — Other Ambulatory Visit: Payer: Self-pay

## 2020-06-26 DIAGNOSIS — R945 Abnormal results of liver function studies: Secondary | ICD-10-CM

## 2020-06-26 DIAGNOSIS — R7989 Other specified abnormal findings of blood chemistry: Secondary | ICD-10-CM

## 2020-06-27 ENCOUNTER — Telehealth: Payer: Self-pay | Admitting: Gastroenterology

## 2020-06-27 NOTE — Telephone Encounter (Signed)
Spoke with patient's mother in regards to lab results, see 06/23/20 result note for more information.

## 2020-06-27 NOTE — Telephone Encounter (Signed)
Patient's mother is returning your call.

## 2020-07-31 ENCOUNTER — Telehealth: Payer: Self-pay

## 2020-07-31 NOTE — Telephone Encounter (Signed)
-----   Message from Missy Sabins, RN sent at 06/26/2020  9:54 AM EST ----- Regarding: Labs Repeat hepatic function panel, order in epic

## 2020-07-31 NOTE — Telephone Encounter (Signed)
Spoke with patient to remind her that she is due for repeat labs at this time. Patient states that she will be in town next week for spring break. Advised patient that no appt is necessary, she can stop by the lab in the basement of our office building between 7:30 AM - 5 PM, Monday through Friday. Patient verbalized understanding and had no concerns at the end of the call.

## 2020-08-06 NOTE — Telephone Encounter (Signed)
Spoke with patient, she states that she will be in tomorrow for her lab work. She states that she was out of town in [place]

## 2020-08-08 ENCOUNTER — Other Ambulatory Visit

## 2020-08-08 DIAGNOSIS — R7989 Other specified abnormal findings of blood chemistry: Secondary | ICD-10-CM

## 2020-08-08 DIAGNOSIS — R945 Abnormal results of liver function studies: Secondary | ICD-10-CM

## 2020-08-08 LAB — HEPATIC FUNCTION PANEL
ALT: 23 U/L (ref 0–35)
AST: 18 U/L (ref 0–37)
Albumin: 4.3 g/dL (ref 3.5–5.2)
Alkaline Phosphatase: 52 U/L (ref 39–117)
Bilirubin, Direct: 0.1 mg/dL (ref 0.0–0.3)
Total Bilirubin: 0.3 mg/dL (ref 0.2–1.2)
Total Protein: 6.9 g/dL (ref 6.0–8.3)
